# Patient Record
Sex: Female | Born: 1945 | Race: White | Hispanic: Yes | Smoking: Current every day smoker
Health system: Southern US, Community
[De-identification: ages and names within clinical notes are randomized; demographics above are authoritative.]

## PROBLEM LIST (undated history)

## (undated) DIAGNOSIS — R32 Unspecified urinary incontinence: Secondary | ICD-10-CM

## (undated) DIAGNOSIS — K219 Gastro-esophageal reflux disease without esophagitis: Secondary | ICD-10-CM

## (undated) DIAGNOSIS — G709 Myoneural disorder, unspecified: Secondary | ICD-10-CM

## (undated) DIAGNOSIS — E785 Hyperlipidemia, unspecified: Secondary | ICD-10-CM

## (undated) DIAGNOSIS — F32A Depression, unspecified: Secondary | ICD-10-CM

## (undated) DIAGNOSIS — I10 Essential (primary) hypertension: Secondary | ICD-10-CM

## (undated) DIAGNOSIS — Z794 Long term (current) use of insulin: Secondary | ICD-10-CM

## (undated) DIAGNOSIS — E119 Type 2 diabetes mellitus without complications: Secondary | ICD-10-CM

## (undated) DIAGNOSIS — F329 Major depressive disorder, single episode, unspecified: Secondary | ICD-10-CM

## (undated) HISTORY — PX: COLOSTOMY REVERSAL: SHX5782

## (undated) HISTORY — DX: Major depressive disorder, single episode, unspecified: F32.9

## (undated) HISTORY — DX: Hyperlipidemia, unspecified: E78.5

## (undated) HISTORY — PX: COLON RESECTION: SHX5231

## (undated) HISTORY — PX: COLON SURGERY: SHX602

## (undated) HISTORY — DX: Type 2 diabetes mellitus without complications: E11.9

## (undated) HISTORY — DX: Long term (current) use of insulin: Z79.4

## (undated) HISTORY — DX: Myoneural disorder, unspecified: G70.9

## (undated) HISTORY — PX: CHOLECYSTECTOMY: SHX55

## (undated) HISTORY — DX: Essential (primary) hypertension: I10

## (undated) HISTORY — DX: Unspecified urinary incontinence: R32

## (undated) HISTORY — DX: Depression, unspecified: F32.A

## (undated) HISTORY — DX: Gastro-esophageal reflux disease without esophagitis: K21.9

---

## 2002-12-05 ENCOUNTER — Ambulatory Visit (HOSPITAL_COMMUNITY): Admission: RE | Admit: 2002-12-05 | Discharge: 2002-12-05 | Payer: Self-pay

## 2008-02-03 ENCOUNTER — Emergency Department (HOSPITAL_COMMUNITY): Admission: EM | Admit: 2008-02-03 | Discharge: 2008-02-04 | Payer: Self-pay | Admitting: Emergency Medicine

## 2008-03-06 ENCOUNTER — Ambulatory Visit (HOSPITAL_COMMUNITY): Admission: RE | Admit: 2008-03-06 | Discharge: 2008-03-06 | Payer: Self-pay | Admitting: Family Medicine

## 2008-10-03 ENCOUNTER — Ambulatory Visit: Payer: Self-pay | Admitting: Cardiology

## 2008-12-04 ENCOUNTER — Ambulatory Visit: Payer: Self-pay | Admitting: Cardiology

## 2009-01-29 ENCOUNTER — Inpatient Hospital Stay (HOSPITAL_COMMUNITY): Admission: EM | Admit: 2009-01-29 | Discharge: 2009-02-01 | Payer: Self-pay | Admitting: Emergency Medicine

## 2009-01-30 ENCOUNTER — Ambulatory Visit: Payer: Self-pay | Admitting: Gastroenterology

## 2009-02-01 ENCOUNTER — Encounter: Payer: Self-pay | Admitting: Gastroenterology

## 2009-02-20 ENCOUNTER — Ambulatory Visit: Payer: Self-pay | Admitting: Gastroenterology

## 2009-02-20 DIAGNOSIS — R197 Diarrhea, unspecified: Secondary | ICD-10-CM | POA: Insufficient documentation

## 2009-02-20 DIAGNOSIS — K859 Acute pancreatitis without necrosis or infection, unspecified: Secondary | ICD-10-CM | POA: Insufficient documentation

## 2009-02-21 ENCOUNTER — Encounter: Payer: Self-pay | Admitting: Gastroenterology

## 2009-03-08 ENCOUNTER — Telehealth (INDEPENDENT_AMBULATORY_CARE_PROVIDER_SITE_OTHER): Payer: Self-pay | Admitting: *Deleted

## 2009-03-12 ENCOUNTER — Encounter: Payer: Self-pay | Admitting: Gastroenterology

## 2009-04-25 ENCOUNTER — Encounter: Payer: Self-pay | Admitting: Gastroenterology

## 2009-04-26 ENCOUNTER — Encounter: Payer: Self-pay | Admitting: Gastroenterology

## 2009-09-20 IMAGING — CT CT ABDOMEN W/ CM
2 of 5 series · 16 of 46 positions shown, 18 images · IV contrast (Omnipaque 300)
Comparison: None

CT ABDOMEN

CLINICAL DATA: Right upper quadrant abdominal pain and diarrhea.

CT ABDOMEN AND PELVIS WITH CONTRAST
TECHNIQUE: Multidetector CT imaging of the abdomen and pelvis was
performed using the standard protocol following bolus
administration of intravenous contrast.
Contrast: 100 ml of Hmnipaque-I77.

[Series 2: abd_pel_with 5.0 b40f · axial · 0.68mm/px · z∈[-389,-4]mm · 13 of 89 slices shown, 15 images]
[im 6/89  soft-tissue]
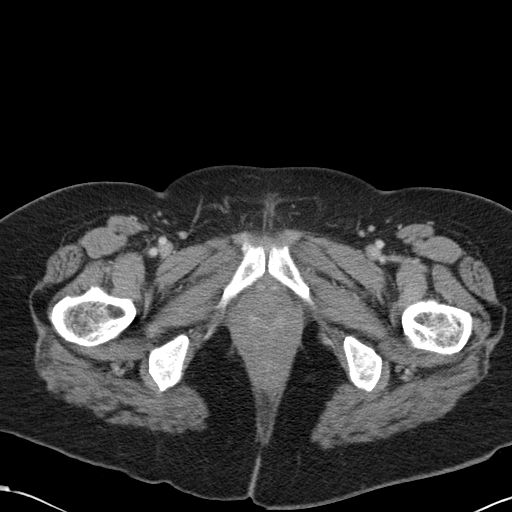
[im 6/89  bone]
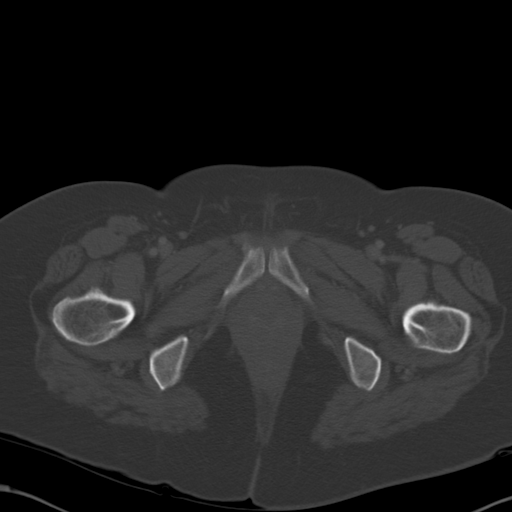
[im 12/89  soft-tissue]
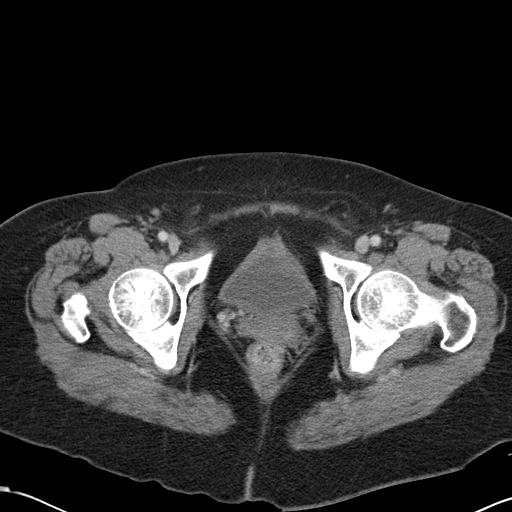
[im 18/89  soft-tissue]
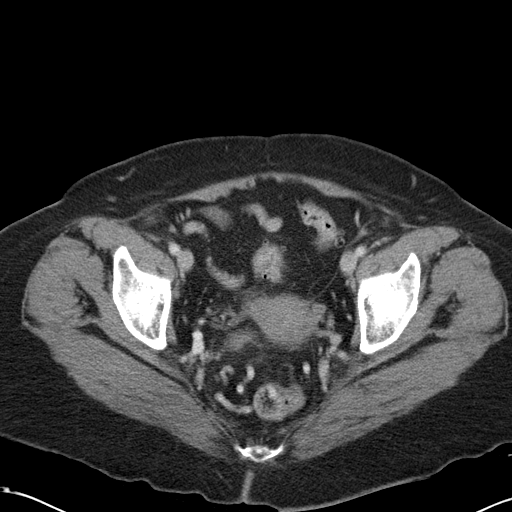
[im 24/89  soft-tissue]
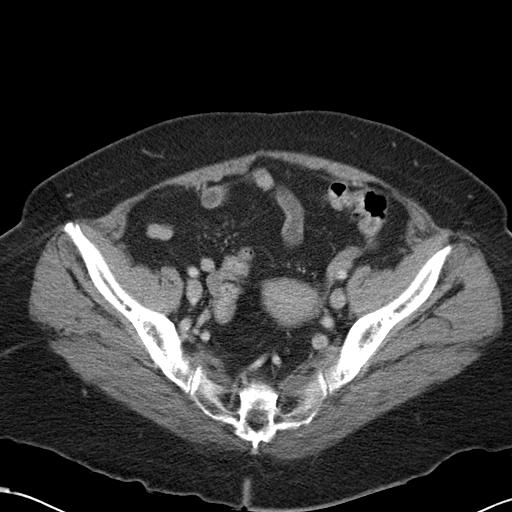
[im 30/89  soft-tissue]
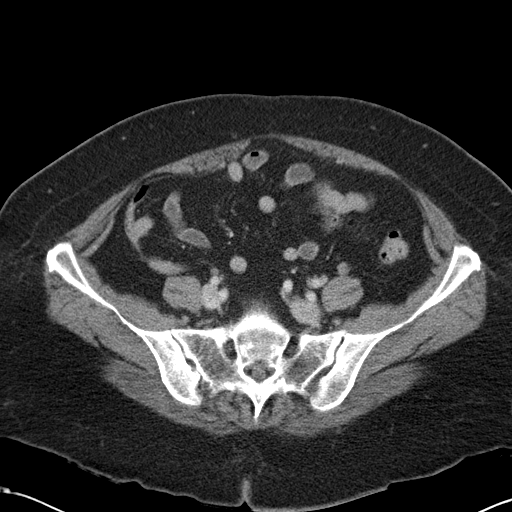
[im 36/89  soft-tissue]
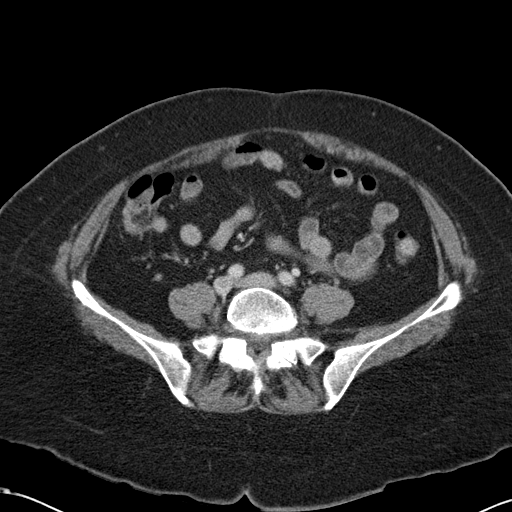
[im 47/89  soft-tissue]
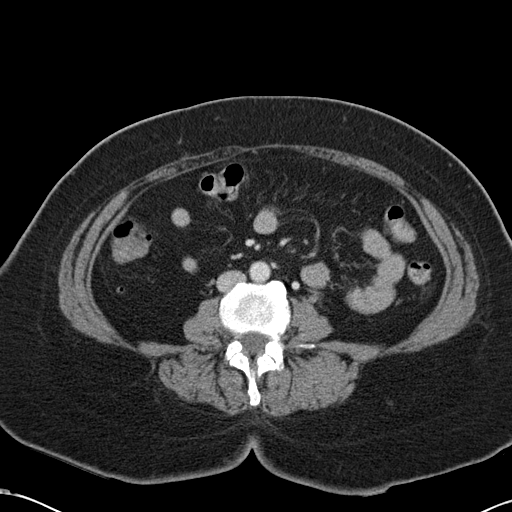
[im 53/89  soft-tissue]
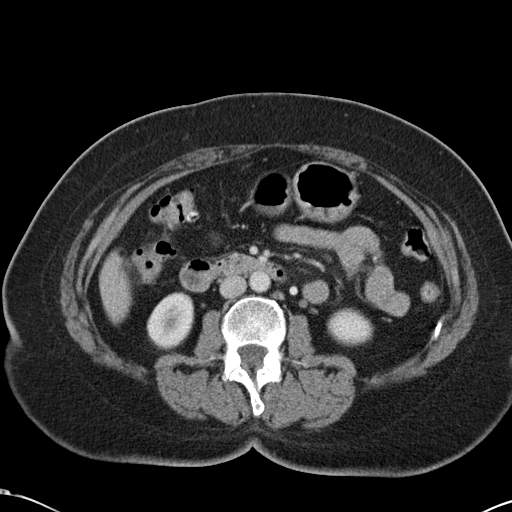
[im 59/89  soft-tissue]
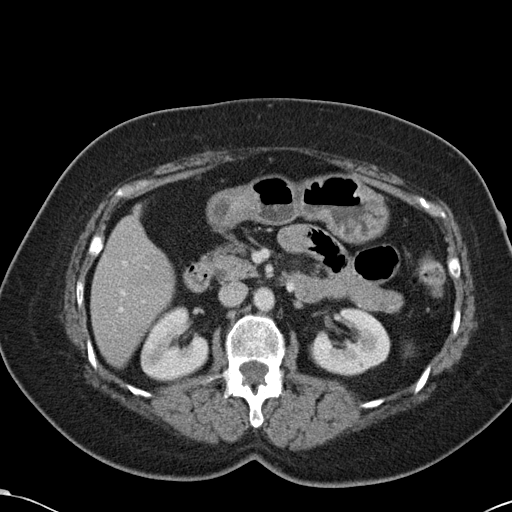
[im 59/89  bone]
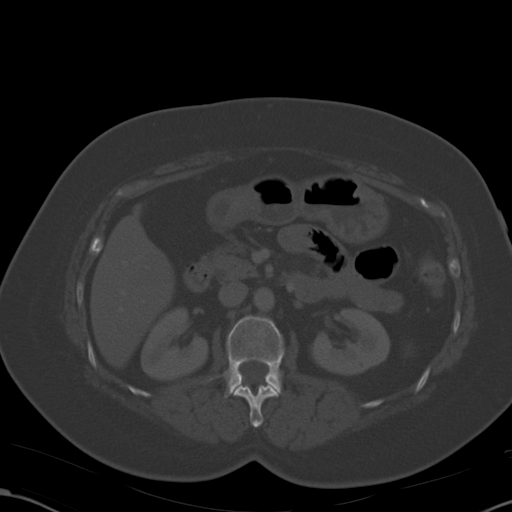
[im 65/89  soft-tissue]
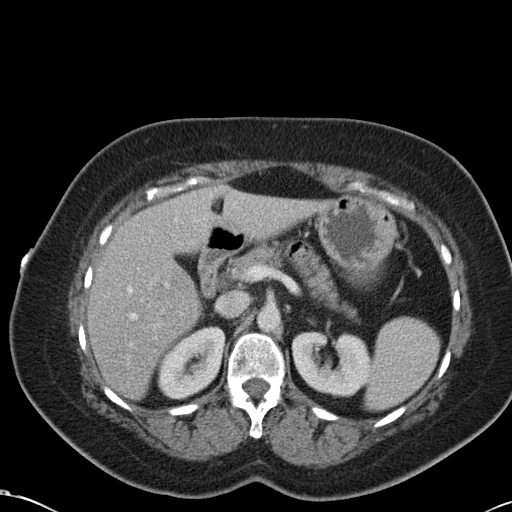
[im 71/89  soft-tissue]
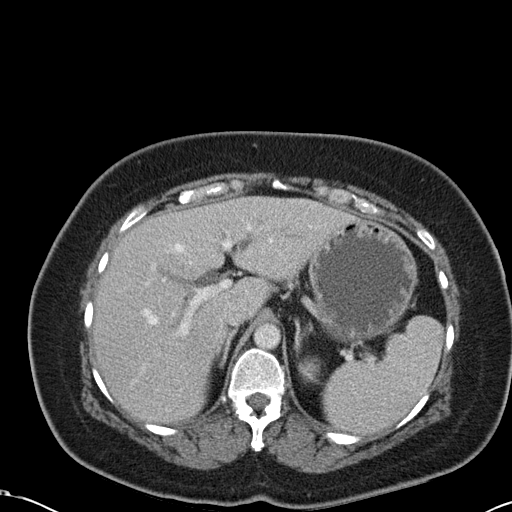
[im 77/89  soft-tissue]
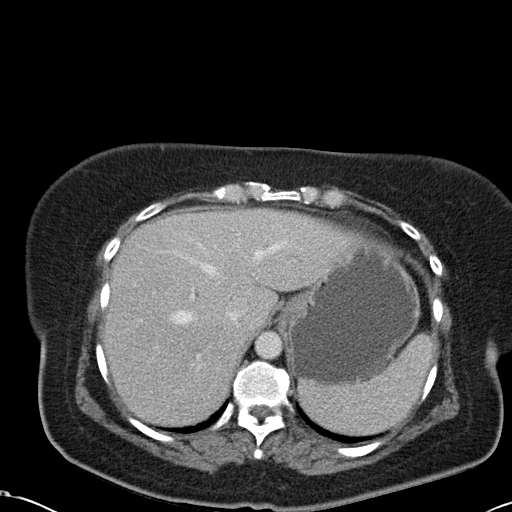
[im 83/89  soft-tissue]
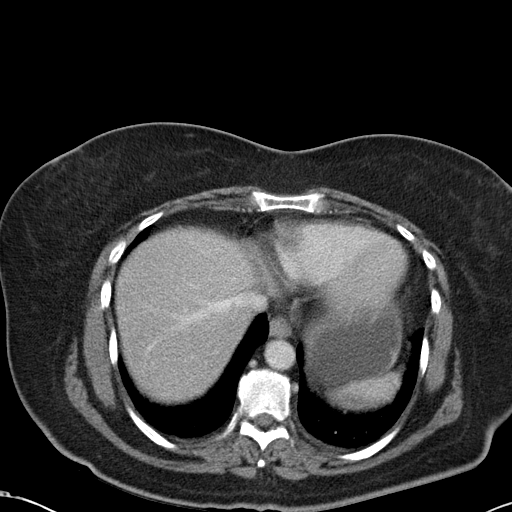

[Series 4: mpr cor post contrast (id) · coronal · 0.72mm/px · 3 of 73 slices shown]
[im 25/73  soft-tissue]
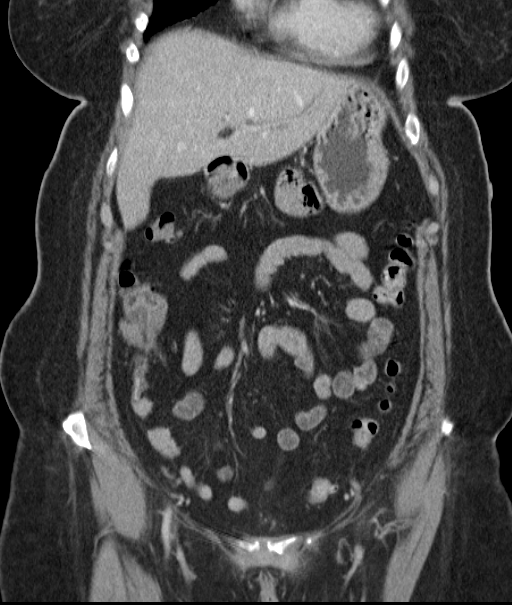
[im 33/73  soft-tissue]
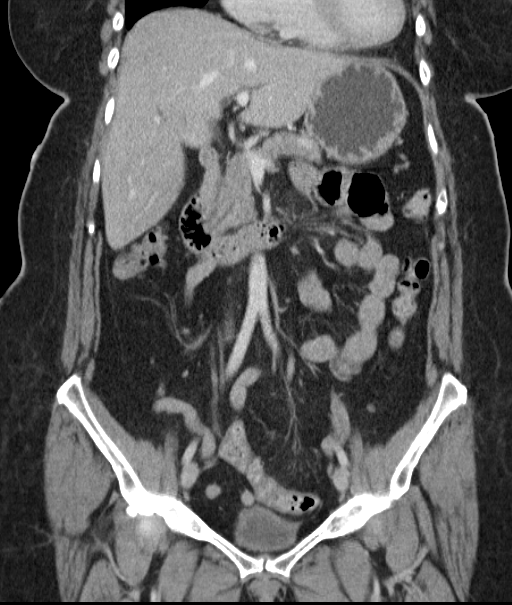
[im 41/73  soft-tissue]
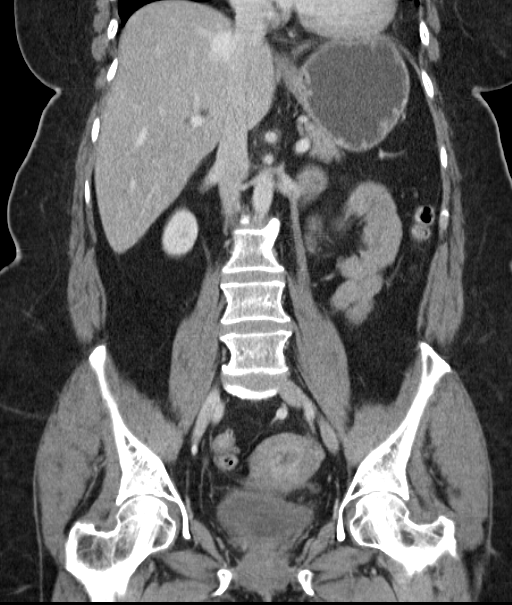

[16 of 46 positions shown; findings below may reference images not displayed]

FINDINGS: The lung bases are clear except for dependent
atelectasis.  The heart is mildly enlarged.  No pericardial
effusion.

The the patient has had a cholecystectomy.  There is mild intra and
extrahepatic biliary dilatation.  This is likely due to the prior
cholecystectomy but recommend correlation with liver function
studies.  If these are abnormal the patient may need an ERCP or
MRCP for further evaluation.  No pancreatic head mass or findings
for pancreatitis.  The spleen is normal in size.  The adrenal
glands and kidneys demonstrate no significant abnormalities.

The stomach, duodenum, small bowel and colon demonstrate no
significant findings.  There are scattered colonic diverticuli.

The aorta is normal in caliber.  No mesenteric or retroperitoneal
masses or adenopathy.  There are a few scattered nodes.

The bony structures are unremarkable.
IMPRESSION: 1.  No acute abdominal findings, mass lesions or adenopathy.
2.  Intra and extrahepatic biliary dilatation likely due to
previous cholecystectomy.  Recommend correlation with liver
function studies.
3.  Scattered colonic diverticulosis.

CT PELVIS
FINDINGS: The rectum, sigmoid colon and visualized small bowel
loops are unremarkable except for sigmoid diverticulosis. The
appendix is visualized and is normal. The uterus and ovaries appear
normal.  The bladder is unremarkable.  Prominent pelvic venous
structures likely due to pelvic congestion syndrome.  Anatomic
variation of the left ovarian vein ending in the splenic vein
instead of the left renal vein. No pelvic mass or adenopathy.  No
inguinal mass or hernia.  The bony pelvis is intact.  Mild osteitis
condensans ilei on the right side at
IMPRESSION: 1.  No acute pelvic findings, mass lesions or adenopathy.
2.  Sigmoid diverticulosis.
3.  Prominent pelvic veins suggesting pelvic congestion syndrome.

## 2010-10-07 ENCOUNTER — Encounter: Payer: Self-pay | Admitting: Gastroenterology

## 2010-10-08 ENCOUNTER — Encounter: Payer: Self-pay | Admitting: Gastroenterology

## 2010-11-13 NOTE — Letter (Signed)
Summary: CLINIC NOTE FROM DR OUTLAW  CLINIC NOTE FROM DR OUTLAW   Imported By: Rexene Alberts 10/08/2010 09:50:20  _____________________________________________________________________  External Attachment:    Type:   Image     Comment:   External Document

## 2010-11-13 NOTE — Letter (Signed)
Summary: OP REPORT  OP REPORT   Imported By: Rexene Alberts 10/07/2010 14:43:51  _____________________________________________________________________  External Attachment:    Type:   Image     Comment:   External Document

## 2011-01-21 LAB — HEPATIC FUNCTION PANEL
ALT: 36 U/L — ABNORMAL HIGH (ref 0–35)
ALT: 40 U/L — ABNORMAL HIGH (ref 0–35)
ALT: 57 U/L — ABNORMAL HIGH (ref 0–35)
AST: 24 U/L (ref 0–37)
AST: 99 U/L — ABNORMAL HIGH (ref 0–37)
Albumin: 2.9 g/dL — ABNORMAL LOW (ref 3.5–5.2)
Bilirubin, Direct: 0.1 mg/dL (ref 0.0–0.3)
Bilirubin, Direct: 0.2 mg/dL (ref 0.0–0.3)
Bilirubin, Direct: 0.5 mg/dL — ABNORMAL HIGH (ref 0.0–0.3)
Indirect Bilirubin: 0.4 mg/dL (ref 0.3–0.9)
Indirect Bilirubin: 0.7 mg/dL (ref 0.3–0.9)
Total Bilirubin: 0.6 mg/dL (ref 0.3–1.2)
Total Bilirubin: 1.2 mg/dL (ref 0.3–1.2)

## 2011-01-21 LAB — BASIC METABOLIC PANEL
BUN: 10 mg/dL (ref 6–23)
CO2: 32 mEq/L (ref 19–32)
Chloride: 107 mEq/L (ref 96–112)
Glucose, Bld: 120 mg/dL — ABNORMAL HIGH (ref 70–99)
Potassium: 3.2 mEq/L — ABNORMAL LOW (ref 3.5–5.1)
Sodium: 143 mEq/L (ref 135–145)

## 2011-01-21 LAB — URINALYSIS, ROUTINE W REFLEX MICROSCOPIC
Nitrite: NEGATIVE
Specific Gravity, Urine: 1.01 (ref 1.005–1.030)
Urobilinogen, UA: >8 mg/dL — ABNORMAL HIGH (ref 0.0–1.0)

## 2011-01-21 LAB — COMPREHENSIVE METABOLIC PANEL
Alkaline Phosphatase: 176 U/L — ABNORMAL HIGH (ref 39–117)
BUN: 8 mg/dL (ref 6–23)
Glucose, Bld: 130 mg/dL — ABNORMAL HIGH (ref 70–99)
Potassium: 3.2 mEq/L — ABNORMAL LOW (ref 3.5–5.1)
Total Protein: 5.9 g/dL — ABNORMAL LOW (ref 6.0–8.3)

## 2011-01-21 LAB — POCT CARDIAC MARKERS: Myoglobin, poc: 51 ng/mL (ref 12–200)

## 2011-01-21 LAB — CBC
HCT: 36.6 % (ref 36.0–46.0)
HCT: 37.8 % (ref 36.0–46.0)
HCT: 39.6 % (ref 36.0–46.0)
Hemoglobin: 12.8 g/dL (ref 12.0–15.0)
Hemoglobin: 13.2 g/dL (ref 12.0–15.0)
Hemoglobin: 13.7 g/dL (ref 12.0–15.0)
MCHC: 34.7 g/dL (ref 30.0–36.0)
MCHC: 34.9 g/dL (ref 30.0–36.0)
MCHC: 34.9 g/dL (ref 30.0–36.0)
RBC: 4.47 MIL/uL (ref 3.87–5.11)
RDW: 13.4 % (ref 11.5–15.5)
RDW: 13.9 % (ref 11.5–15.5)
RDW: 13.9 % (ref 11.5–15.5)

## 2011-01-21 LAB — DIFFERENTIAL
Basophils Absolute: 0 10*3/uL (ref 0.0–0.1)
Basophils Absolute: 0 10*3/uL (ref 0.0–0.1)
Basophils Absolute: 0 10*3/uL (ref 0.0–0.1)
Basophils Relative: 0 % (ref 0–1)
Basophils Relative: 0 % (ref 0–1)
Basophils Relative: 0 % (ref 0–1)
Eosinophils Relative: 2 % (ref 0–5)
Eosinophils Relative: 3 % (ref 0–5)
Monocytes Absolute: 0.3 10*3/uL (ref 0.1–1.0)
Monocytes Absolute: 0.3 10*3/uL (ref 0.1–1.0)
Monocytes Relative: 4 % (ref 3–12)
Monocytes Relative: 5 % (ref 3–12)
Neutro Abs: 4.5 10*3/uL (ref 1.7–7.7)
Neutro Abs: 6.6 10*3/uL (ref 1.7–7.7)
Neutrophils Relative %: 74 % (ref 43–77)

## 2011-01-21 LAB — HEPATITIS A ANTIBODY, TOTAL: Hep A Total Ab: POSITIVE — AB

## 2011-01-21 LAB — CLOSTRIDIUM DIFFICILE EIA: C difficile Toxins A+B, EIA: NEGATIVE

## 2011-01-21 LAB — OVA AND PARASITE EXAMINATION: Ova and parasites: NONE SEEN

## 2011-01-21 LAB — PROTIME-INR
INR: 1 (ref 0.00–1.49)
Prothrombin Time: 13.8 seconds (ref 11.6–15.2)

## 2011-01-21 LAB — GLUCOSE, CAPILLARY: Glucose-Capillary: 138 mg/dL — ABNORMAL HIGH (ref 70–99)

## 2011-02-24 NOTE — Discharge Summary (Signed)
[name], [name]            ACCOUNT NO.:  [id number]   MEDICAL RECORD NO.:  [id number]          PATIENT TYPE:  INP   LOCATION:  A337                          FACILITY:  [facility]   PHYSICIAN:  Skeet Latch, DO    DATE OF BIRTH:  24-Feb-1946   DATE OF ADMISSION:  01/29/2009  DATE OF DISCHARGE:  04/23/2010LH                               DISCHARGE SUMMARY   DISCHARGE DIAGNOSES:  1. Acute-on-chronic abdominal pain.  2. Chronic diarrhea.  3. History of hypertension.  4. History of type 2 diabetes.  5. History of gastroesophageal reflux disease.   BRIEF HOSPITAL COURSE:  This is a 65 year old Latino female who  presented with abdominal pain with associated nausea, vomiting,  diarrhea.  The patient stated that her symptoms were off and on since  she had a gallbladder removed approximately 18 years ago.  She states  her pain was located in epigastric region, radiates to her chest usually  associated with some vomiting.  She has had a chronic diarrhea since her  surgery 18 years ago.  This episode of pain is approximately a 2-week  history that radiated to her right shoulder and her throat.  She denied  any melena or rectal bleeding of any kind.  Upon being admitted, her  labs showed a white count, hemoglobin normal range, potassium was 3.2,  lipase was 205, alk phos was 176, AST 130, ALT 96.  Her CT showed a mild  intra and extrahepatic biliary dilatation likely due to prior  cholecystectomy.  It was recommended to correlate with her liver  function studies.  Upon being seen, Gastroenterology was consulted.  They recommended MRCP to evaluate her LFTs of biliary system.  The  patient was kept n.p.o., her diet was increased.  The patient had the  MRCP which showed an increased caliber of the common bile duct and  central intrahepatic bile ducts.  No evidence of choledocholithiasis or  obstructing mass.  I spoke with Gastroenterology, was not an impressive  MRCP.  I feel the patient  will probably follow up as an outpatient with  them in their office.   MEDICATIONS ON DISCHARGE:  1. Metformin 500 mg daily.  2. Celexa 10 mg daily.  3. Flexeril 10 mg daily.  4. Glipizide ER 5 mg one-half tab twice a day.  5. Tylenol With Codeine 300/30 mg as needed.  6. Atenolol/chlorthalidone 50/25 mg daily.  7. Omeprazole 40 mg daily.  8. Phenergan 25 mg 1 p.o. q.4-6 h. for nausea.   PHYSICAL EXAMINATION:  VITAL SIGNS:  Temp is 97.7, pulse 61,  respirations 20, blood pressure 97/54.  She is sating 93%.   LABORATORY DATA:  Alk phosphatase 109, AST 24, ALT 40, total protein is  5.3, albumin 2.9.  Her hepatitis C antibody was positive, lipase was 26.   CONDITION ON DISCHARGE:  Stable.   DISPOSITION:  The patient was discharged home with family members.   DISCHARGE INSTRUCTIONS:  The patient is to maintain a diabetic diet.  She is to increase her activity slowly.  The patient is to follow up  with her primary care physician in  the next 3-5 days.  She is to follow  up with Dr. Cira Servant in 2-3 weeks.  She should take all her medications as  prescribed.  The patient is to return to the emergency room and/or call  [phone] if she has any more severe abdominal pain, discomfort associated  intractable nausea, vomiting, or diarrhea.      Skeet Latch, DO  Electronically Signed     SM/MEDQ  D:  02/01/2009  T:  02/02/2009  Job:  [id number]   cc:   Kassie Mends, M.D.  [address]  [place] , [place] [address]

## 2011-02-24 NOTE — Consult Note (Signed)
[name], [name]            ACCOUNT NO.:  [id number]   MEDICAL RECORD NO.:  [id number]          PATIENT TYPE:  INP   LOCATION:  A337                          FACILITY:  [facility]   PHYSICIAN:  Kassie Mends, M.D.      DATE OF BIRTH:  [birth year]   DATE OF CONSULTATION:  01/30/2009  DATE OF DISCHARGE:                                 CONSULTATION   REASON FOR CONSULTATION:  Epigastric pain, nausea, vomiting, diarrhea.   HISTORY OF PRESENT ILLNESS:  The patient is a very pleasant 65 year old  Latino female who presented with acute on chronic abdominal pain  associated with nausea, vomiting, diarrhea. She says she has had  symptoms off and on ever since she had her gallbladder removed in [place]  about 18 years ago.  She was living in the [place] but at that  time had no forms of insurance and decided to have her surgery back in  [place].  She will have episodes of epigastric pain which radiates up  into her chest, usually associated with vomiting.  She has had chronic  diarrhea since her cholecystectomy.  Generally has multiple 7-8 stools  daily.  Stools are always post prandial.  If she avoids eating, she does  not have any stools.  In the past, she has been treated with multiple  medications for acid reflux with really no long-term benefits.  She  usually goes to the doctor and gets a shot and symptoms go away within  a day or two.  She came in this time with a two week history of  persisting epigastric pain which radiates into the right shoulder and  right throat area.  She states that the pain is severe enough that she  vomits.  She believes her diarrhea is worse as well.  She denies any  hematemesis, melena or rectal bleeding.  She has been on omeprazole 40  mg daily and takes Pepto-Bismol p.r.n.  Her daughter provides  translation for her today.  She denies any episodes of constipation.  There are times that she is pain free.  She states her sugars have been  more difficult to  control but still remain under 200.  She had a  colonoscopy about 10 years ago in [place].  She states it was normal  except she was told her colon was swollen.  She denies any prior upper  endoscopy.   Upon this admission, she had a CT which showed mild intra- and  extrahepatic biliary dilation, likely due to prior cholecystectomy that  was recommended to correlate with liver function studies.  ERCP or MRCP  recommended if abnormal.  Her pancreas appeared normal without any  evidence of pancreatitis.  She has scattered colonic diverticulosis.  She has prominent pelvic vein suggestive of pelvic congestion syndrome.   Labs revealed normal white count, normal hemoglobin.  Her potassium is  low at 3.2.  Her lipase was up at 205 today at 83.  Her total bilirubin  was 1, alk-phos 176. AST 130, ALT 96, albumin 3.4.  INR normal,  creatinine normal.   HOME MEDICATIONS:  1. Glipizide 2.5  mg b.i.d.  2. Tylenol with codeine p.r.n.  3. Atenolol/CHLOR 50/25 mg daily.  4. Metformin 500 mg daily.  5. Ambien 10 mg daily.  6. Omeprazole 40 mg daily.  7. Citalopram 10 mg daily.  8. Flexeril 10 mg daily.  9. Pepto-Bismol p.r.n.   ALLERGIES:  No known drug allergies.   PAST MEDICAL HISTORY:  1. Diabetes mellitus.  2. Hypertension.  3. GERD.   FAMILY HISTORY:  Negative for chronic GI illness, colorectal cancer, GI  malignancy or liver disease.   SOCIAL HISTORY:  She is widowed. She has five children.  She does not  speak [place].  Denies any tobacco use.  She quit about 10 years ago,  but only smoked 2-3 cigarettes a day.  Denies any alcohol use or drug  use.   REVIEW OF SYSTEMS:  See HPI for GI.  CONSTITUTIONAL:  No weight loss.  CARDIOPULMONARY:  Denies shortness of breath, palpitations.  GU:  Denies  dysuria, hematuria.   PHYSICAL EXAMINATION:  VITAL SIGNS:  Temperature 97.9, pulse 61,  respirations 20, blood pressure 115/68.  O2 saturation 99% on room air.  Height 63 inches, weight  80.5 kg.  GENERAL:  Pleasant, elderly, Latino  female in no acute distress.SKIN:  Warm and dry, no jaundice. HEENT:  Sclerae nonicteric.  Oropharyngeal mucosa moist.  CHEST:  Lungs clear to  auscultation.  CARDIAC:  Regular rate and rhythm.  No murmurs. ABDOMEN:  Obese,  positive bowel sounds.  Mild to moderate epigastric tenderness to deep  palpation.  No rebound or guarding.  No organomegaly or masses.  No  abdominal bruits or hernias.EXTREMITIES:  Lower extremities, no edema.   LABORATORY DATA:  As outlined above.  In addition, her hemoglobin is  13.2.  Sodium 138, creatinine 0.86.   IMPRESSION:  The patient is a very pleasant 65 year old lady with acute  on chronic (intermittent) epigastric pain associated with nausea and  vomiting.  She also has chronic diarrhea which has worsened.  She has  been treated empirically in the past for acid reflux disease with PPI  therapy and states she also tends to get a shot which helps her  symptoms.  For the past two weeks, she has had recurrent symptoms and  does not seems to have any improvement, and because of persisting pain  and vomiting, she presenting to the emergency department for further  management.  CT shows mild intra- and extrahepatic biliary dilation  status post cholecystectomy.  This could be a normal finding post  cholecystectomy.  However, LFTs are up as well as her lipase.  Based on  these findings, would be concerned about biliary obstruction as the  cause of her intermittent symptoms.  I doubt we are dealing with an  acute viral hepatitis.  She needs further evaluation of her biliary  system via MRCP (as discussed with Dr.  Kassie Mends).  The patient is  claustrophobic, but will try to manage that with valium, and she is  agreeable to attempt MR.  She has chronic postprandial diarrhea, 7-8  nonbloody stools daily, ever since her cholecystectomy 18 years ago.  Symptoms are currently worse according to the patient.  We need to  rule  out superimposed infection at this point.  Chronic diarrhea differential  includes bile acid induced diarrhea, IBD, microscopic colitis.   RECOMMENDATIONS:  1. MRCP to further evaluate LFTs and biliary system.  Based on labs,      she appears to have mild pancreatitis, although, no findings  on CT      to correlate with this.  Based on MRCP findings, she may need to      have an ERCP.  2. If no source of her abdominal pain and vomiting are found, would      consider upper endoscopy on a later date.  3. Consider colonoscopy for further evaluation of diarrhea.      Initially, will collect stool for OMP culture, C. difficile and      lactoferrin.  4. Will keep her n.p.o. for now.  5. Continue PPI therapy.  6. Discontinue Senna.  7. Replete potassium as per hospitalist management.   ADDENDUM:  EUS in  June 2010      Tana Coast, P.A.      Kassie Mends, M.D.  Electronically Signed    LL/MEDQ  D:  01/30/2009  T:  01/30/2009  Job:  [id number]   cc:   Dorris Singh  Fax: [phone]   Ernestina Penna, M.D.  Fax: [phone]   [facility] B Team

## 2011-02-24 NOTE — Discharge Summary (Signed)
NAMEJAINA, MORIN            ACCOUNT NO.:  [id number]   MEDICAL RECORD NO.:  [id number]          PATIENT TYPE:  INP   LOCATION:  A337                          FACILITY:  [facility]   PHYSICIAN:  Skeet Latch, DO    DATE OF BIRTH:  18-Jul-1946   DATE OF ADMISSION:  01/29/2009  DATE OF DISCHARGE:  04/23/2010LH                               DISCHARGE SUMMARY   ADDENDUM   DISCHARGE MEDICATIONS:  Protonix 40 mg one tab p.o. daily and Tylox 1-2  caps p.o. q.6 h p.r.n. and take off omeprazole 40 mg daily.      Skeet Latch, DO  Electronically Signed     SM/MEDQ  D:  02/01/2009  T:  02/02/2009  Job:  [id number]

## 2011-02-24 NOTE — H&P (Signed)
[name], [name]            ACCOUNT NO.:  [id number]   MEDICAL RECORD NO.:  [id number]          PATIENT TYPE:  INP   LOCATION:  A337                          FACILITY:  [facility]   PHYSICIAN:  Dorris Singh, DO    DATE OF BIRTH:  11-17-1945   DATE OF ADMISSION:  01/29/2009  DATE OF DISCHARGE:  LH                              HISTORY & PHYSICAL   The patient is a 64 year old Latino female who presented to [facility]  emergency room with a chief complaint of abdominal pain.  She stated  that it was right upper quadrant in nature.  It was also located in the  epigastric area, extends to her neck, into her right arm and today it  has been severe.  She had been treated recently for reflux and had some  medication changes without any luck.  Her daughter is here with her as a  Nurse, learning disability but this pain started a week ago and became acute today.  She  did have her gallbladder out about 13 years ago and does have chronic  diarrhea.  Nothing relieved her symptoms.  They are aggravated by eating  at that point and they have been associated with diarrhea, nausea and  vomiting.   REVIEW OF SYSTEMS:  CONSTITUTIONAL:  Positive for appetite change.  Negative for fever or chills.  EYES:  Negative for changes in vision or  pain.  EARS, NOSE, AND THROAT:  No changes in hearing, smell or taste.  CARDIOVASCULAR:  Negative for chest pain.  RESPIRATORY:  Negative for  wheezing or shortness of breath.  GASTROINTESTINAL:  Positive for  nausea, vomiting, diarrhea, abdominal pain.  GU: Negative for dysuria or  flank pain.  MUSCULOSKELETAL:  Negative for arthralgias or arthritis.  Neck pain.  SKIN:  Negative for rash.  NEURO:  Negative for headaches.  PSYCHIATRIC:  Negative for depression.  HEMATOLOGIC:  Negative for  anemia.   ALLERGIES:  No known drug allergies.   MEDICATIONS:  1. Glipizide 5 mg b.i.d.  2. Tylenol with codeine 300/30.  No frequency given.  3. Atenolol 50/25.  No frequency given.  4.  Metformin 500 mg.  No frequency given.  5. Zyloprim 10 mg.  No frequency given.  6. Omeprazole 40 mg.  No frequency given.   PAST MEDICAL HISTORY:  1. Diabetes.  2. Hypertension.   FAMILY HISTORY:  There is none.   SURGICAL HISTORY:  None.   SOCIAL HISTORY:  She is nonsmoker, nondrinker.  No drug abuse.  She is  also full code.   PHYSICAL EXAMINATION:  VITAL SIGNS:  Temperature 97.9, pulse 69,  respirations 20 and blood pressure 125/92.  GENERAL:  The patient is a 65 year old Latino woman who is well-  developed, well-nourished, in no acute distress.  HEENT:  Head is normocephalic, atraumatic.  PERRL.  EOMI.  No scleral  icterus or injection.  Ears:  TMI visualized bilaterally.  Nose:  Turbinates are moist.  Throat:  Mucous membranes are moist.  NECK:  Supple.  No lymphadenopathy.  HEART:  Regular rate and rhythm.  LUNGS:  Clear to auscultation bilaterally.  No rales,  wheezes or  rhonchi.  ABDOMEN:  It was soft with tenderness in the right upper quadrant and  mid epigastric region.  EXTREMITIES:  Positive pulses.  No edema,  ecchymosis or cyanosis.   LABORATORY DATA:  First set cardiac enzymes are negative.  White count  of 8.2, hemoglobin 13.7, hematocrit 39.6, platelet count 205,000.  Sodium is 138, potassium 3.2, CO2 97, carbon dioxide 30, glucose 130,  BUN 10, creatinine 0.84.  Her hepatic function is 99 for AST.  ALT is  36.  Total bilirubin is 1.2, direct is 0.5, indirect is 0.7.  Lipase is  205.  Her urine is negative.  A CT of her abdomen shows no acute intra-  abdominal findings.  No masses or adenopathy.  Intra and extrahepatic  biliary dilatation likely due to previous cholecystectomy.  Recommend  correlation with liver function studies, scattered chronic  diverticulosis and her pelvis shows no acute pelvic findings, mass  lesions or adenopathy, sigmoid diverticulosis, prominent pelvic veins  suggesting pelvic congestion syndrome.  Dr. Cira Servant was contacted   regarding her admission and she will see her tomorrow.   ASSESSMENT/PLAN:  1. Right upper quadrant abdominal pain.  2. Intractable nausea and vomiting.  3. Hypertension.  4. Diabetes.   The patient will be  admitted to the service of [facility].  Will make  her n.p.o. after midnight tonight.  Will place her on her home  medications.  Will place IV fluids.  Will do DVT and GI prophylaxis.  As  mentioned  Dr. Cira Servant has been consulted and will continue to monitor her  and make changes as necessary.      Dorris Singh, DO  Electronically Signed     CB/MEDQ  D:  01/29/2009  T:  01/30/2009  Job:  [id number]

## 2014-10-28 DIAGNOSIS — K5900 Constipation, unspecified: Secondary | ICD-10-CM | POA: Diagnosis not present

## 2014-10-28 DIAGNOSIS — K2970 Gastritis, unspecified, without bleeding: Secondary | ICD-10-CM | POA: Diagnosis not present

## 2014-10-28 DIAGNOSIS — N390 Urinary tract infection, site not specified: Secondary | ICD-10-CM | POA: Diagnosis not present

## 2014-10-28 DIAGNOSIS — N3090 Cystitis, unspecified without hematuria: Secondary | ICD-10-CM | POA: Diagnosis not present

## 2016-06-10 ENCOUNTER — Ambulatory Visit (INDEPENDENT_AMBULATORY_CARE_PROVIDER_SITE_OTHER): Payer: Medicare Other | Admitting: Family Medicine

## 2016-06-10 ENCOUNTER — Encounter: Payer: Self-pay | Admitting: Family Medicine

## 2016-06-10 VITALS — BP 130/76 | HR 60 | Resp 18 | Ht 60.0 in | Wt 200.0 lb

## 2016-06-10 DIAGNOSIS — Z7689 Persons encountering health services in other specified circumstances: Secondary | ICD-10-CM

## 2016-06-10 DIAGNOSIS — N3281 Overactive bladder: Secondary | ICD-10-CM | POA: Insufficient documentation

## 2016-06-10 DIAGNOSIS — Z79899 Other long term (current) drug therapy: Secondary | ICD-10-CM

## 2016-06-10 DIAGNOSIS — G20 Parkinson's disease: Secondary | ICD-10-CM | POA: Diagnosis not present

## 2016-06-10 DIAGNOSIS — K219 Gastro-esophageal reflux disease without esophagitis: Secondary | ICD-10-CM | POA: Diagnosis not present

## 2016-06-10 DIAGNOSIS — I10 Essential (primary) hypertension: Secondary | ICD-10-CM | POA: Insufficient documentation

## 2016-06-10 DIAGNOSIS — F32A Depression, unspecified: Secondary | ICD-10-CM | POA: Insufficient documentation

## 2016-06-10 DIAGNOSIS — K909 Intestinal malabsorption, unspecified: Secondary | ICD-10-CM

## 2016-06-10 DIAGNOSIS — Z23 Encounter for immunization: Secondary | ICD-10-CM

## 2016-06-10 DIAGNOSIS — Z136 Encounter for screening for cardiovascular disorders: Secondary | ICD-10-CM

## 2016-06-10 DIAGNOSIS — Z7189 Other specified counseling: Secondary | ICD-10-CM | POA: Diagnosis not present

## 2016-06-10 DIAGNOSIS — H6691 Otitis media, unspecified, right ear: Secondary | ICD-10-CM | POA: Insufficient documentation

## 2016-06-10 DIAGNOSIS — H7291 Unspecified perforation of tympanic membrane, right ear: Secondary | ICD-10-CM

## 2016-06-10 DIAGNOSIS — E785 Hyperlipidemia, unspecified: Secondary | ICD-10-CM

## 2016-06-10 DIAGNOSIS — F329 Major depressive disorder, single episode, unspecified: Secondary | ICD-10-CM | POA: Insufficient documentation

## 2016-06-10 DIAGNOSIS — E669 Obesity, unspecified: Secondary | ICD-10-CM

## 2016-06-10 DIAGNOSIS — K589 Irritable bowel syndrome without diarrhea: Secondary | ICD-10-CM | POA: Insufficient documentation

## 2016-06-10 DIAGNOSIS — E119 Type 2 diabetes mellitus without complications: Secondary | ICD-10-CM

## 2016-06-10 DIAGNOSIS — E1169 Type 2 diabetes mellitus with other specified complication: Secondary | ICD-10-CM | POA: Insufficient documentation

## 2016-06-10 MED ORDER — ATENOLOL-CHLORTHALIDONE 50-25 MG PO TABS: 1.0000 | ORAL_TABLET | Freq: Every day | ORAL | 3 refills | Status: DC

## 2016-06-10 MED ORDER — DICYCLOMINE HCL 10 MG PO CAPS: 10.0000 mg | ORAL_CAPSULE | Freq: Three times a day (TID) | ORAL | 3 refills | Status: DC

## 2016-06-10 MED ORDER — ATORVASTATIN CALCIUM 20 MG PO TABS: 20.0000 mg | ORAL_TABLET | Freq: Every day | ORAL | 3 refills | Status: DC

## 2016-06-10 MED ORDER — PIOGLITAZONE HCL 30 MG PO TABS: 30.0000 mg | ORAL_TABLET | Freq: Every day | ORAL | 3 refills | Status: DC

## 2016-06-10 MED ORDER — MIRABEGRON ER 50 MG PO TB24: 50.0000 mg | ORAL_TABLET | Freq: Every day | ORAL | 3 refills | Status: DC

## 2016-06-10 MED ORDER — GLUCOSE BLOOD VI STRP: ORAL_STRIP | 3 refills | Status: DC

## 2016-06-10 MED ORDER — DULOXETINE HCL 30 MG PO CPEP: 30.0000 mg | ORAL_CAPSULE | Freq: Every day | ORAL | 1 refills | Status: DC

## 2016-06-10 MED ORDER — PRIMIDONE 50 MG PO TABS: 50.0000 mg | ORAL_TABLET | Freq: Three times a day (TID) | ORAL | 1 refills | Status: DC

## 2016-06-10 MED ORDER — AMOXICILLIN 875 MG PO TABS: 875.0000 mg | ORAL_TABLET | Freq: Two times a day (BID) | ORAL | 0 refills | Status: DC

## 2016-06-10 MED ORDER — PANTOPRAZOLE SODIUM 40 MG PO TBEC: 40.0000 mg | DELAYED_RELEASE_TABLET | Freq: Every day | ORAL | 3 refills | Status: DC

## 2016-06-10 MED ORDER — INSULIN DETEMIR 100 UNIT/ML ~~LOC~~ SOLN: 25.0000 [IU] | Freq: Two times a day (BID) | SUBCUTANEOUS | 2 refills | Status: DC

## 2016-06-10 NOTE — Addendum Note (Signed)
Addended by: Kandis FantasiaSLADE, COURTNEY B on: 06/10/2016 02:49 PM   Modules accepted: Orders

## 2016-06-10 NOTE — Patient Instructions (Addendum)
Medicines are refilled  Stop citalopram  Take duloxetine once a day instead  Lab work ordered I will let you know the test results in a letter  Amoxicillin is for the ear infection Take two times a day for 10 days  Need follow up in a couple of months Call sooner for problems or questions

## 2016-06-10 NOTE — Progress Notes (Addendum)
Chief Complaint  Patient presents with  . Establish Care    moved here from [place] (Dr. Louanna Raw- [place])    Patient is a 70 year old Hispanic female who is accompanied by her daughter. The patient does not speak [place]. Patient refuses a Nurse, learning disability. She prefers to have her daughter to the translation. The [place] is signed. She has multiple medical problems. She is out of all of her medications. She does not have medical records. She is a fair historian, speaking to her daughter who translates. She has insulin-dependent diabetes. She's had diabetes for over 10 years. She requests to go off of insulin. She has not had any blood work and hemoglobin A1c testing for the last 6 months. She states her diabetes is well controlled. She states that she goes for yearly eye exams and her last exam was 6 months ago. She states she does not have any retinopathy or diabetes related eye changes. She denies any kidney problems. She states she does sometimes have numbness and pain in her feet. She does not have any wounds or history of diabetic ulcerations. She does not wear diabetic shoes. She is on hyperlipidemia treatment takes Lipitor 20 mg a day. Unknown last cholesterol. She is on an antihypertensive but not an ACE inhibitor. At future visit I will amend this. She states she checks her sugar daily. She is fairly careful with her diet. She gets no regular exercise. She denies any high or low sugars. Long-standing blood pressure has been well controlled. Patient has chronic abdominal cramping and diarrhea. She has irritable bowel syndrome. She takes when necessary Bentyl. She does not take a fiber supplement. She has a vague history of her colon "leaking" 4 years ago that required emergency surgery and colostomy. She had subsequent surgery to reverse the colostomy. She was told she did not have any blockage mass or cancer. Cause of the "leak" is uncertain. Old records are requested. Patient has a history of  Parkinson's disease. Her only medicine is Mysoline. She takes this for "shaking". She states it helps. She has a history of depression. She is on citalopram 40 mg a day. She states this is not working. She would like a different medication. I will switch her from citalopram to duloxetine at this visit to see if it helps with her foot pain and depression. She has a history of incontinence and "overactive bladder". She is on medication. She requires adult diapers. She has a history of GERD. She takes Protonix 40 mg a day. She states this does control her symptoms. Because of the high dose and chronic diarrhea, I am getting levels of vitamin D and B12 to check for malabsorption. She has an acute complaint today of right ear pain. It's been hurting for 2 weeks. She does holding her right ear as it is quite uncomfortable. She has cotton stuck in the ear. She does tell me she has a history of a perforated eardrum on that side. She has decreased hearing in the right ear. Denies any cold or sinus symptoms, no fever.  Patient Active Problem List   Diagnosis Date Noted  . GERD (gastroesophageal reflux disease) 06/10/2016  . Parkinson's disease (HCC) 06/10/2016  . Essential hypertension 06/10/2016  . IBS (irritable bowel syndrome) 06/10/2016  . Diabetes mellitus type 2 in obese (HCC) 06/10/2016  . OAB (overactive bladder) 06/10/2016  . HLD (hyperlipidemia) 06/10/2016  . Acute otitis media of right ear with perforated tympanic membrane 06/10/2016  . Depression 06/10/2016  . DIARRHEA NOS  02/20/2009   Outpatient Encounter Prescriptions as of 06/10/2016  Medication Sig  . amoxicillin (AMOXIL) 875 MG tablet Take 1 tablet (875 mg total) by mouth 2 (two) times daily.  Marland Kitchen atenolol-chlorthalidone (TENORETIC) 50-25 MG tablet Take 1 tablet by mouth daily.  Marland Kitchen atorvastatin (LIPITOR) 20 MG tablet Take 1 tablet (20 mg total) by mouth daily.  Marland Kitchen dicyclomine (BENTYL) 10 MG capsule Take 1 capsule (10 mg total) by mouth 4  (four) times daily -  before meals and at bedtime.  . DULoxetine (CYMBALTA) 30 MG capsule Take 1 capsule (30 mg total) by mouth daily.  . insulin detemir (LEVEMIR) 100 UNIT/ML injection Inject 0.25 mLs (25 Units total) into the skin 2 (two) times daily.  . mirabegron ER (MYRBETRIQ) 50 MG TB24 tablet Take 1 tablet (50 mg total) by mouth daily.  . pantoprazole (PROTONIX) 40 MG tablet Take 1 tablet (40 mg total) by mouth daily.  . pioglitazone (ACTOS) 30 MG tablet Take 1 tablet (30 mg total) by mouth daily.  . primidone (MYSOLINE) 50 MG tablet Take 1 tablet (50 mg total) by mouth 3 (three) times daily.   No facility-administered encounter medications on file as of 06/10/2016.     Family History  Problem Relation Age of Onset  . Diabetes Mother   . Cancer Brother     bone  . HIV/AIDS Brother     Social History   Social History  . Marital status: Widowed    Spouse name: N/A  . Number of children: N/A  . Years of education: N/A   Occupational History  . Not on file.   Social History Main Topics  . Smoking status: Current Every Day Smoker    Packs/day: 0.10    Types: Cigarettes  . Smokeless tobacco: Never Used  . Alcohol use No  . Drug use: No  . Sexual activity: Not Currently   Review of Systems  Constitutional: Negative for chills, fever, malaise/fatigue and weight loss.  HENT: Positive for ear discharge, ear pain and hearing loss. Negative for congestion.        Right ear 2 weeks  Eyes: Negative for blurred vision and pain.  Respiratory: Negative for cough and shortness of breath.   Cardiovascular: Negative for chest pain, palpitations and leg swelling.  Gastrointestinal: Negative for abdominal pain, constipation, diarrhea and heartburn.  Genitourinary: Negative for dysuria and frequency.  Musculoskeletal: Negative for falls, joint pain and myalgias.  Skin: Negative.   Neurological: Positive for tremors. Negative for dizziness, seizures and headaches.    Psychiatric/Behavioral: Negative for depression, memory loss and suicidal ideas. The patient is not nervous/anxious and does not have insomnia.        Depression.  Sleeps poorly   Physical Exam  Constitutional: She is oriented to person, place, and time. She appears well-developed and well-nourished. She appears distressed.  Uncomfortable.  Holding R ear  HENT:  Head: Normocephalic and atraumatic.  Right Ear: There is drainage. Tympanic membrane is perforated. Decreased hearing is noted.  Left Ear: External ear normal.  Ears:  Nose: Nose normal.  Mouth/Throat: Oropharynx is clear and moist.  Eyes: Conjunctivae are normal. Pupils are equal, round, and reactive to light.  Neck: Normal range of motion. Neck supple. No thyromegaly present.  Cardiovascular: Normal rate, regular rhythm and normal heart sounds.   Pulmonary/Chest: Effort normal and breath sounds normal. No respiratory distress.  Musculoskeletal: Normal range of motion. She exhibits no edema.  Lymphadenopathy:    She has no cervical adenopathy.  Neurological:  She is alert and oriented to person, place, and time.  Psychiatric: She has a normal mood and affect. Her behavior is normal.  Quiet.  Daughter did all the talking    DIAGNOSIS/PLAN:  1. Gastroesophageal reflux disease, esophagitis presence not specified Continue protonix.  Attempt to reduce dose to 20 mg a day.  Check labs.  2. Parkinson's disease (HCC)  - B12  3. Essential hypertension  - COMPLETE METABOLIC PANEL WITH GFR - Lipid Profile - TSH - CBC  4. IBS (irritable bowel syndrome)   5. Diabetes mellitus type 2 in obese (HCC)  - COMPLETE METABOLIC PANEL WITH GFR - HgB [id number] - Lipid Profile - Vitamin D (25 hydroxy) - B12 - TSH - CBC Will try to get off of insulin at her request, after checking labs.  Needs to be on an ACE inhibitor.  Needs yearly eye exams.    6. OAB (overactive bladder)   7. Encounter for immunization  - Flu Vaccine QUAD  36+ mos IM  Patient Instructions  Medicines are refilled  Stop citalopram  Take duloxetine once a day instead  Lab work ordered I will let you know the test results in a letter  Amoxicillin is for the ear infection Take two times a day for 10 days  Need follow up in a couple of months Call sooner for problems or questions

## 2016-06-11 ENCOUNTER — Other Ambulatory Visit: Payer: Self-pay

## 2016-06-11 DIAGNOSIS — G20 Parkinson's disease: Secondary | ICD-10-CM | POA: Diagnosis not present

## 2016-06-11 DIAGNOSIS — K909 Intestinal malabsorption, unspecified: Secondary | ICD-10-CM | POA: Diagnosis not present

## 2016-06-11 DIAGNOSIS — Z79899 Other long term (current) drug therapy: Secondary | ICD-10-CM | POA: Diagnosis not present

## 2016-06-11 DIAGNOSIS — E669 Obesity, unspecified: Secondary | ICD-10-CM | POA: Diagnosis not present

## 2016-06-11 DIAGNOSIS — K589 Irritable bowel syndrome without diarrhea: Secondary | ICD-10-CM | POA: Diagnosis not present

## 2016-06-11 DIAGNOSIS — E119 Type 2 diabetes mellitus without complications: Secondary | ICD-10-CM | POA: Diagnosis not present

## 2016-06-11 DIAGNOSIS — Z136 Encounter for screening for cardiovascular disorders: Secondary | ICD-10-CM | POA: Diagnosis not present

## 2016-06-11 DIAGNOSIS — I10 Essential (primary) hypertension: Secondary | ICD-10-CM | POA: Diagnosis not present

## 2016-06-11 LAB — COMPLETE METABOLIC PANEL WITH GFR
ALBUMIN: 3.7 g/dL (ref 3.6–5.1)
ALK PHOS: 121 U/L (ref 33–130)
ALT: 8 U/L (ref 6–29)
AST: 14 U/L (ref 10–35)
BILIRUBIN TOTAL: 0.6 mg/dL (ref 0.2–1.2)
BUN: 18 mg/dL (ref 7–25)
CALCIUM: 8.9 mg/dL (ref 8.6–10.4)
CO2: 28 mmol/L (ref 20–31)
CREATININE: 1.07 mg/dL — AB (ref 0.50–0.99)
Chloride: 101 mmol/L (ref 98–110)
GFR, Est African American: 62 mL/min (ref >=60)
GFR, Est Non African American: 54 mL/min — ABNORMAL LOW (ref >=60)
GLUCOSE: 135 mg/dL — AB (ref 65–99)
POTASSIUM: 3.7 mmol/L (ref 3.5–5.3)
SODIUM: 142 mmol/L (ref 135–146)
TOTAL PROTEIN: 6.4 g/dL (ref 6.1–8.1)

## 2016-06-11 LAB — LIPID PANEL
CHOL/HDL RATIO: 2.6 ratio (ref <=5.0)
Cholesterol: 166 mg/dL (ref 125–200)
HDL: 65 mg/dL (ref >=46)
LDL CALC: 81 mg/dL (ref <130)
TRIGLYCERIDES: 102 mg/dL (ref <150)
VLDL: 20 mg/dL (ref <30)

## 2016-06-11 LAB — CBC
HEMATOCRIT: 39.9 % (ref 35.0–45.0)
HEMOGLOBIN: 13.2 g/dL (ref 11.7–15.5)
MCH: 29.2 pg (ref 27.0–33.0)
MCHC: 33.1 g/dL (ref 32.0–36.0)
MCV: 88.3 fL (ref 80.0–100.0)
MPV: 10.2 fL (ref 7.5–12.5)
Platelets: 215 10*3/uL (ref 140–400)
RBC: 4.52 MIL/uL (ref 3.80–5.10)
RDW: 14.5 % (ref 11.0–15.0)
WBC: 6 10*3/uL (ref 3.8–10.8)

## 2016-06-11 LAB — TSH: TSH: 2.52 mIU/L

## 2016-06-11 LAB — VITAMIN B12: Vitamin B-12: 311 pg/mL (ref 200–1100)

## 2016-06-11 MED ORDER — INSULIN DETEMIR 100 UNIT/ML ~~LOC~~ SOLN: 25.0000 [IU] | Freq: Two times a day (BID) | SUBCUTANEOUS | 2 refills | Status: AC

## 2016-06-11 MED ORDER — AMOXICILLIN 875 MG PO TABS: 875.0000 mg | ORAL_TABLET | Freq: Two times a day (BID) | ORAL | 0 refills | Status: DC

## 2016-06-11 MED ORDER — ATORVASTATIN CALCIUM 20 MG PO TABS: 20.0000 mg | ORAL_TABLET | Freq: Every day | ORAL | 3 refills | Status: AC

## 2016-06-11 MED ORDER — DULOXETINE HCL 30 MG PO CPEP: 30.0000 mg | ORAL_CAPSULE | Freq: Every day | ORAL | 1 refills | Status: AC

## 2016-06-11 MED ORDER — PANTOPRAZOLE SODIUM 40 MG PO TBEC: 40.0000 mg | DELAYED_RELEASE_TABLET | Freq: Every day | ORAL | 3 refills | Status: AC

## 2016-06-11 MED ORDER — ATENOLOL-CHLORTHALIDONE 50-25 MG PO TABS
1.0000 | ORAL_TABLET | Freq: Every day | ORAL | 3 refills | Status: DC
Start: 2016-06-11 — End: 2016-06-25

## 2016-06-11 MED ORDER — PRIMIDONE 50 MG PO TABS: 50.0000 mg | ORAL_TABLET | Freq: Three times a day (TID) | ORAL | 1 refills | Status: DC

## 2016-06-11 MED ORDER — DICYCLOMINE HCL 10 MG PO CAPS: 10.0000 mg | ORAL_CAPSULE | Freq: Three times a day (TID) | ORAL | 3 refills | Status: AC

## 2016-06-11 MED ORDER — GLUCOSE BLOOD VI STRP: ORAL_STRIP | 3 refills | Status: AC

## 2016-06-11 MED ORDER — PIOGLITAZONE HCL 30 MG PO TABS: 30.0000 mg | ORAL_TABLET | Freq: Every day | ORAL | 3 refills | Status: DC

## 2016-06-11 MED ORDER — MIRABEGRON ER 50 MG PO TB24: 50.0000 mg | ORAL_TABLET | Freq: Every day | ORAL | 3 refills | Status: AC

## 2016-06-12 ENCOUNTER — Encounter: Payer: Self-pay | Admitting: Family Medicine

## 2016-06-12 LAB — HEMOGLOBIN A1C
HEMOGLOBIN A1C: 6.7 % — AB (ref <5.7)
MEAN PLASMA GLUCOSE: 146 mg/dL

## 2016-06-12 LAB — MICROALBUMIN / CREATININE URINE RATIO
CREATININE, URINE: 198 mg/dL (ref 20–320)
MICROALB UR: 0.7 mg/dL
Microalb Creat Ratio: 4 mcg/mg creat (ref <30)

## 2016-06-12 LAB — VITAMIN D 25 HYDROXY (VIT D DEFICIENCY, FRACTURES): VIT D 25 HYDROXY: 23 ng/mL — AB (ref 30–100)

## 2016-06-25 ENCOUNTER — Ambulatory Visit (INDEPENDENT_AMBULATORY_CARE_PROVIDER_SITE_OTHER): Payer: Medicare Other | Admitting: Family Medicine

## 2016-06-25 ENCOUNTER — Encounter: Payer: Self-pay | Admitting: Family Medicine

## 2016-06-25 VITALS — BP 128/62 | HR 70 | Temp 99.0°F | Resp 18 | Ht 60.0 in | Wt 202.0 lb

## 2016-06-25 DIAGNOSIS — Z23 Encounter for immunization: Secondary | ICD-10-CM

## 2016-06-25 DIAGNOSIS — G20 Parkinson's disease: Secondary | ICD-10-CM

## 2016-06-25 DIAGNOSIS — H6691 Otitis media, unspecified, right ear: Secondary | ICD-10-CM | POA: Diagnosis not present

## 2016-06-25 DIAGNOSIS — E669 Obesity, unspecified: Secondary | ICD-10-CM

## 2016-06-25 DIAGNOSIS — H7291 Unspecified perforation of tympanic membrane, right ear: Secondary | ICD-10-CM

## 2016-06-25 DIAGNOSIS — I10 Essential (primary) hypertension: Secondary | ICD-10-CM | POA: Diagnosis not present

## 2016-06-25 DIAGNOSIS — E1169 Type 2 diabetes mellitus with other specified complication: Secondary | ICD-10-CM

## 2016-06-25 DIAGNOSIS — E119 Type 2 diabetes mellitus without complications: Secondary | ICD-10-CM

## 2016-06-25 MED ORDER — LISINOPRIL-HYDROCHLOROTHIAZIDE 20-25 MG PO TABS: 1.0000 | ORAL_TABLET | Freq: Every day | ORAL | 3 refills | Status: AC

## 2016-06-25 MED ORDER — METFORMIN HCL 500 MG PO TABS: 500.0000 mg | ORAL_TABLET | Freq: Two times a day (BID) | ORAL | 3 refills | Status: AC

## 2016-06-25 NOTE — Patient Instructions (Signed)
I am referring you to an ear specialist for the ruptured ear drum  I am referring you to a neurologist for the tremor For now stay on the mysoline 3 times a day  I am changing the blood pressure medicine.  Stop the tenoretic and start the lisinopril This blood pressure medicine better protects the kidneys  You may cut the insulin in half and use 12 u in the morning and 16 u in the evening Start the metformin 500 mg twice a day with breakfast and dinner  Walk every day that you are able.  Walk on the treadmill at slow speed.  Start with 5-10 min at a time and work up to 15 min twice a day.  See me in a month.  Bring in your blood sugar log  Call sooner for any problems  NEED OLD RECORDS

## 2016-06-25 NOTE — Progress Notes (Signed)
Chief Complaint  Patient presents with  . Ear Drainage    right ear drainage continues    70 year old Hispanic woman new to area. Refuses a Nurse, learning disabilitytranslator. Prefers to use her daughter. Is here for follow-up. Multiple issues to manage. She has an insulin-dependent diabetic. I do not have old records. However, some laboratory work at her last visit. These test results are reviewed with her. Her diabetes is well managed. Her hemoglobin A1c is 6.7. Her fasting surgery was 135. She has had no high or low sugars. She states that when she was diagnosed as diabetic she was put directly on insulin. She does not want to take insulin. She would like a trial of oral hypoglycemic. She is on over 50 units a day. I am uncertain whether we can discontinue her insulin completely, but I will add metformin, cut her insulin in half, and follow her sugars to see whether the insulin can be reduced. She objects both to the injections and the cost of insulin use. She also complains she has gained weight. She states that she has neuropathy in her hands and feet. Her hands wake her up at night than they are numb. She has not seen a podiatrist. She does not have any foot wounds, ulcerations, surgeries She denies any eye problems from her diabetes. She states she's had an eye exam within the last year. These records are requested. Her lab work did show mildly impaired creatinine. She is on a beta blocker/thiazide like pressure pill. I'm going to switch her to an ACE inhibitor to better protect her kidneys and comply with current diabetes recommendations. Patient states she has Parkinson's disease. She uses Mysoline 3 times a day. She does not feel like it helps with her tremor. The daughter states she walks very slowly. They want a stronger medication. Without additional records I am hesitant to start her on Sinemet. I'm going to send her for a neurology consultation. At her last visit she had an ear infection with ruptured  tympanic membrane. She states the pain is improved. She still can't hear well. She still having drainage from the ear. She would like this rechecked.  Patient Active Problem List   Diagnosis Date Noted  . GERD (gastroesophageal reflux disease) 06/10/2016  . Parkinson's disease (HCC) 06/10/2016  . Essential hypertension 06/10/2016  . IBS (irritable bowel syndrome) 06/10/2016  . Diabetes mellitus type 2 in obese (HCC) 06/10/2016  . OAB (overactive bladder) 06/10/2016  . HLD (hyperlipidemia) 06/10/2016  . Acute otitis media of right ear with perforated tympanic membrane 06/10/2016  . Depression 06/10/2016  . DIARRHEA NOS 02/20/2009    Outpatient Encounter Prescriptions as of 06/25/2016  Medication Sig  . atorvastatin (LIPITOR) 20 MG tablet Take 1 tablet (20 mg total) by mouth daily.  Marland Kitchen. dicyclomine (BENTYL) 10 MG capsule Take 1 capsule (10 mg total) by mouth 4 (four) times daily -  before meals and at bedtime.  . DULoxetine (CYMBALTA) 30 MG capsule Take 1 capsule (30 mg total) by mouth daily.  Marland Kitchen. glucose blood (ACCU-CHEK AVIVA PLUS) test strip Use as instructed for twice daily testing E11.9  . insulin detemir (LEVEMIR) 100 UNIT/ML injection Inject 0.25 mLs (25 Units total) into the skin 2 (two) times daily.  . mirabegron ER (MYRBETRIQ) 50 MG TB24 tablet Take 1 tablet (50 mg total) by mouth daily.  . pantoprazole (PROTONIX) 40 MG tablet Take 1 tablet (40 mg total) by mouth daily.  . pioglitazone (ACTOS) 30 MG tablet Take 1  tablet (30 mg total) by mouth daily.  . primidone (MYSOLINE) 50 MG tablet Take 1 tablet (50 mg total) by mouth 3 (three) times daily.  . [DISCONTINUED] atenolol-chlorthalidone (TENORETIC) 50-25 MG tablet Take 1 tablet by mouth daily.  Marland Kitchen lisinopril-hydrochlorothiazide (PRINZIDE,ZESTORETIC) 20-25 MG tablet Take 1 tablet by mouth daily.  . metFORMIN (GLUCOPHAGE) 500 MG tablet Take 1 tablet (500 mg total) by mouth 2 (two) times daily with a meal.  . [DISCONTINUED] amoxicillin  (AMOXIL) 875 MG tablet Take 1 tablet (875 mg total) by mouth 2 (two) times daily.   No facility-administered encounter medications on file as of 06/25/2016.     No Known Allergies  Review of Systems  Constitutional: Negative for activity change and unexpected weight change.       Gained 2 pounds  HENT: Positive for ear discharge, ear pain and hearing loss. Negative for congestion, dental problem, postnasal drip and rhinorrhea.   Eyes: Negative.  Negative for visual disturbance.  Respiratory: Negative.  Negative for cough and shortness of breath.   Cardiovascular: Negative for chest pain, palpitations and leg swelling.  Gastrointestinal: Negative for constipation and diarrhea.  Endocrine: Negative for polydipsia and polyuria.  Genitourinary: Negative.  Negative for difficulty urinating.       Occasional stress incontinence  Musculoskeletal: Positive for arthralgias and myalgias.       Describes general achiness in "bones"  Neurological: Positive for tremors.       Slow gait. No falls.  Psychiatric/Behavioral: Negative for dysphoric mood and sleep disturbance.       History of depression, controlled    BP 128/62   Pulse 70   Temp 99 F (37.2 C)   Resp 18   Ht 5' (1.524 m)   Wt 202 lb (91.6 kg)   SpO2 96%   BMI 39.45 kg/m   Physical Exam  Constitutional: She is oriented to person, place, and time. She appears well-developed and well-nourished.  HENT:  Head: Normocephalic and atraumatic.  Right Ear: External ear normal.  Left Ear: External ear normal.  Ears:  Mouth/Throat: Oropharynx is clear and moist.  Eyes: Conjunctivae are normal. Pupils are equal, round, and reactive to light.  Neck: Normal range of motion.  Cardiovascular: Normal rate, regular rhythm and normal heart sounds.   Pulmonary/Chest: Effort normal and breath sounds normal. No respiratory distress. She has no wheezes.  Musculoskeletal: Normal range of motion. She exhibits no edema.  Lymphadenopathy:     She has no cervical adenopathy.  Neurological: She is alert and oriented to person, place, and time.  Psychiatric: She has a normal mood and affect. Her behavior is normal. Thought content normal.       ASSESSMENT/PLAN:   1. Diabetes mellitus type 2 in obese (HCC) Going to try to taper insulin and start metformin. Patient will keep a log of her blood sugars and bring back to next visit. She will call for problems.  2. Acute otitis media of right ear with perforated tympanic membrane  - Ambulatory referral to ENT  3. Parkinson's disease (HCC)  - Ambulatory referral to Neurology  4. Essential hypertension Discontinue atenolol. Start lisinopril. Follow blood pressure.   Patient Instructions  I am referring you to an ear specialist for the ruptured ear drum  I am referring you to a neurologist for the tremor For now stay on the mysoline 3 times a day  I am changing the blood pressure medicine.  Stop the tenoretic and start the lisinopril This blood pressure medicine better  protects the kidneys  You may cut the insulin in half and use 12 u in the morning and 16 u in the evening Start the metformin 500 mg twice a day with breakfast and dinner  Walk every day that you are able.  Walk on the treadmill at slow speed.  Start with 5-10 min at a time and work up to 15 min twice a day.  See me in a month.  Bring in your blood sugar log  Call sooner for any problems  NEED OLD RECORDS   Eustace Moore, MD

## 2016-07-06 ENCOUNTER — Ambulatory Visit (INDEPENDENT_AMBULATORY_CARE_PROVIDER_SITE_OTHER): Payer: Medicare Other | Admitting: Otolaryngology

## 2016-07-08 ENCOUNTER — Ambulatory Visit: Payer: Self-pay | Admitting: Neurology

## 2016-07-08 ENCOUNTER — Telehealth: Payer: Self-pay | Admitting: *Deleted

## 2016-07-08 NOTE — Telephone Encounter (Signed)
No showed new patient appointment. 

## 2016-07-09 ENCOUNTER — Encounter: Payer: Self-pay | Admitting: Neurology

## 2016-07-27 ENCOUNTER — Ambulatory Visit: Payer: Medicare Other | Admitting: Family Medicine

## 2016-09-10 ENCOUNTER — Ambulatory Visit: Payer: Medicare Other | Admitting: Family Medicine

## 2016-11-30 DIAGNOSIS — N3941 Urge incontinence: Secondary | ICD-10-CM | POA: Diagnosis not present

## 2016-11-30 DIAGNOSIS — Z0001 Encounter for general adult medical examination with abnormal findings: Secondary | ICD-10-CM | POA: Diagnosis not present

## 2016-11-30 DIAGNOSIS — E7800 Pure hypercholesterolemia, unspecified: Secondary | ICD-10-CM | POA: Diagnosis not present

## 2016-11-30 DIAGNOSIS — Z6835 Body mass index (BMI) 35.0-35.9, adult: Secondary | ICD-10-CM | POA: Diagnosis not present

## 2016-11-30 DIAGNOSIS — R300 Dysuria: Secondary | ICD-10-CM | POA: Diagnosis not present

## 2016-11-30 DIAGNOSIS — E119 Type 2 diabetes mellitus without complications: Secondary | ICD-10-CM | POA: Diagnosis not present

## 2016-11-30 DIAGNOSIS — R1312 Dysphagia, oropharyngeal phase: Secondary | ICD-10-CM | POA: Diagnosis not present

## 2016-11-30 DIAGNOSIS — I10 Essential (primary) hypertension: Secondary | ICD-10-CM | POA: Diagnosis not present

## 2016-11-30 DIAGNOSIS — F17210 Nicotine dependence, cigarettes, uncomplicated: Secondary | ICD-10-CM | POA: Diagnosis not present

## 2016-11-30 DIAGNOSIS — Z794 Long term (current) use of insulin: Secondary | ICD-10-CM | POA: Diagnosis not present

## 2016-11-30 DIAGNOSIS — Z716 Tobacco abuse counseling: Secondary | ICD-10-CM | POA: Diagnosis not present

## 2016-12-04 DIAGNOSIS — H401131 Primary open-angle glaucoma, bilateral, mild stage: Secondary | ICD-10-CM | POA: Diagnosis not present

## 2016-12-04 DIAGNOSIS — H2513 Age-related nuclear cataract, bilateral: Secondary | ICD-10-CM | POA: Diagnosis not present

## 2016-12-30 ENCOUNTER — Other Ambulatory Visit: Payer: Self-pay | Admitting: Family Medicine

## 2017-01-09 ENCOUNTER — Other Ambulatory Visit: Payer: Self-pay | Admitting: Family Medicine

## 2017-01-11 NOTE — Telephone Encounter (Signed)
Pt was here to establish care with you on 9 [phone] 17. She has cancelled 2 appt with you since then. We are getting refill requests , but I am not able to reac her.

## 2017-01-22 ENCOUNTER — Other Ambulatory Visit: Payer: Self-pay | Admitting: Family Medicine

## 2017-05-12 ENCOUNTER — Telehealth: Payer: Self-pay

## 2017-05-12 NOTE — Telephone Encounter (Signed)
Called pt to schedule Medicare Annual Wellness Visit. -nr  

## 2017-07-06 DIAGNOSIS — E119 Type 2 diabetes mellitus without complications: Secondary | ICD-10-CM | POA: Diagnosis not present

## 2017-07-06 DIAGNOSIS — H2513 Age-related nuclear cataract, bilateral: Secondary | ICD-10-CM | POA: Diagnosis not present

## 2017-07-06 DIAGNOSIS — H04123 Dry eye syndrome of bilateral lacrimal glands: Secondary | ICD-10-CM | POA: Diagnosis not present

## 2017-07-06 DIAGNOSIS — H40013 Open angle with borderline findings, low risk, bilateral: Secondary | ICD-10-CM | POA: Diagnosis not present

## 2017-12-20 ENCOUNTER — Encounter: Payer: Self-pay | Admitting: Family Medicine

## 2023-03-01 ENCOUNTER — Other Ambulatory Visit (HOSPITAL_COMMUNITY): Payer: Self-pay | Admitting: Family Medicine

## 2023-03-01 DIAGNOSIS — R42 Dizziness and giddiness: Secondary | ICD-10-CM

## 2023-04-07 ENCOUNTER — Ambulatory Visit (HOSPITAL_COMMUNITY): Payer: 59

## 2023-04-14 ENCOUNTER — Encounter (HOSPITAL_COMMUNITY): Payer: Self-pay

## 2023-04-14 ENCOUNTER — Ambulatory Visit (HOSPITAL_COMMUNITY): Admission: RE | Admit: 2023-04-14 | Payer: 59 | Source: Ambulatory Visit

## 2023-11-12 ENCOUNTER — Other Ambulatory Visit (HOSPITAL_COMMUNITY): Payer: Self-pay | Admitting: Family Medicine

## 2023-11-12 ENCOUNTER — Ambulatory Visit (HOSPITAL_COMMUNITY)
Admission: RE | Admit: 2023-11-12 | Discharge: 2023-11-12 | Disposition: A | Discharge status: Home / Self Care | Payer: 59 | Source: Ambulatory Visit | Attending: Family Medicine | Admitting: Family Medicine

## 2023-11-12 DIAGNOSIS — R109 Unspecified abdominal pain: Secondary | ICD-10-CM | POA: Diagnosis present

## 2023-11-12 LAB — POCT I-STAT CREATININE: Creatinine, Ser: 1 mg/dL (ref 0.44–1.00)

## 2023-11-12 MED ORDER — IOHEXOL 300 MG/ML  SOLN
30.0000 mL | Freq: Once | INTRAMUSCULAR | Status: AC | PRN
  Administered 2023-11-12: 30 mL via ORAL

## 2023-11-12 MED ORDER — IOHEXOL 300 MG/ML  SOLN
100.0000 mL | Freq: Once | INTRAMUSCULAR | Status: AC | PRN
  Administered 2023-11-12: 100 mL via INTRAVENOUS

## 2023-11-15 ENCOUNTER — Encounter (HOSPITAL_COMMUNITY): Payer: Self-pay

## 2023-11-15 ENCOUNTER — Emergency Department (HOSPITAL_COMMUNITY): Payer: 59

## 2023-11-15 ENCOUNTER — Emergency Department (HOSPITAL_COMMUNITY)
Admission: EM | Admit: 2023-11-15 | Discharge: 2023-11-15 | Disposition: A | Discharge status: Home / Self Care | Payer: 59 | Attending: Emergency Medicine | Admitting: Emergency Medicine

## 2023-11-15 ENCOUNTER — Other Ambulatory Visit: Payer: Self-pay

## 2023-11-15 DIAGNOSIS — R059 Cough, unspecified: Secondary | ICD-10-CM | POA: Insufficient documentation

## 2023-11-15 DIAGNOSIS — E119 Type 2 diabetes mellitus without complications: Secondary | ICD-10-CM | POA: Insufficient documentation

## 2023-11-15 DIAGNOSIS — G8929 Other chronic pain: Secondary | ICD-10-CM | POA: Diagnosis not present

## 2023-11-15 DIAGNOSIS — H6690 Otitis media, unspecified, unspecified ear: Secondary | ICD-10-CM

## 2023-11-15 DIAGNOSIS — R079 Chest pain, unspecified: Secondary | ICD-10-CM | POA: Diagnosis not present

## 2023-11-15 DIAGNOSIS — K429 Umbilical hernia without obstruction or gangrene: Secondary | ICD-10-CM | POA: Diagnosis not present

## 2023-11-15 DIAGNOSIS — H60501 Unspecified acute noninfective otitis externa, right ear: Secondary | ICD-10-CM | POA: Insufficient documentation

## 2023-11-15 DIAGNOSIS — J210 Acute bronchiolitis due to respiratory syncytial virus: Secondary | ICD-10-CM | POA: Diagnosis not present

## 2023-11-15 DIAGNOSIS — R0602 Shortness of breath: Secondary | ICD-10-CM | POA: Diagnosis not present

## 2023-11-15 DIAGNOSIS — E785 Hyperlipidemia, unspecified: Secondary | ICD-10-CM | POA: Insufficient documentation

## 2023-11-15 DIAGNOSIS — N3000 Acute cystitis without hematuria: Secondary | ICD-10-CM | POA: Insufficient documentation

## 2023-11-15 DIAGNOSIS — Z7984 Long term (current) use of oral hypoglycemic drugs: Secondary | ICD-10-CM | POA: Diagnosis not present

## 2023-11-15 DIAGNOSIS — R1013 Epigastric pain: Secondary | ICD-10-CM | POA: Diagnosis present

## 2023-11-15 DIAGNOSIS — Z20822 Contact with and (suspected) exposure to covid-19: Secondary | ICD-10-CM | POA: Insufficient documentation

## 2023-11-15 DIAGNOSIS — K2900 Acute gastritis without bleeding: Secondary | ICD-10-CM | POA: Diagnosis not present

## 2023-11-15 DIAGNOSIS — H6693 Otitis media, unspecified, bilateral: Secondary | ICD-10-CM | POA: Insufficient documentation

## 2023-11-15 DIAGNOSIS — Z794 Long term (current) use of insulin: Secondary | ICD-10-CM | POA: Diagnosis not present

## 2023-11-15 DIAGNOSIS — Z7901 Long term (current) use of anticoagulants: Secondary | ICD-10-CM | POA: Insufficient documentation

## 2023-11-15 LAB — URINALYSIS, ROUTINE W REFLEX MICROSCOPIC
Bilirubin Urine: NEGATIVE
Glucose, UA: NEGATIVE mg/dL
Ketones, ur: NEGATIVE mg/dL
Nitrite: NEGATIVE
Protein, ur: 30 mg/dL — AB
Specific Gravity, Urine: 1.023 (ref 1.005–1.030)
WBC, UA: >50 WBC/hpf (ref 0–5)
pH: 5 (ref 5.0–8.0)

## 2023-11-15 LAB — CBC
HCT: 43.1 % (ref 36.0–46.0)
Hemoglobin: 14.3 g/dL (ref 12.0–15.0)
MCH: 28.9 pg (ref 26.0–34.0)
MCHC: 33.2 g/dL (ref 30.0–36.0)
MCV: 87.2 fL (ref 80.0–100.0)
Platelets: 208 10*3/uL (ref 150–400)
RBC: 4.94 MIL/uL (ref 3.87–5.11)
RDW: 13.7 % (ref 11.5–15.5)
WBC: 9.5 10*3/uL (ref 4.0–10.5)
nRBC: 0 % (ref 0.0–0.2)

## 2023-11-15 LAB — RESP PANEL BY RT-PCR (RSV, FLU A&B, COVID)  RVPGX2
Influenza A by PCR: NEGATIVE
Influenza B by PCR: NEGATIVE
Resp Syncytial Virus by PCR: POSITIVE — AB
SARS Coronavirus 2 by RT PCR: NEGATIVE

## 2023-11-15 LAB — COMPREHENSIVE METABOLIC PANEL
ALT: 11 U/L (ref 0–44)
AST: 13 U/L — ABNORMAL LOW (ref 15–41)
Albumin: 3.4 g/dL — ABNORMAL LOW (ref 3.5–5.0)
Alkaline Phosphatase: 118 U/L (ref 38–126)
Anion gap: 11 (ref 5–15)
BUN: 10 mg/dL (ref 8–23)
CO2: 26 mmol/L (ref 22–32)
Calcium: 8.7 mg/dL — ABNORMAL LOW (ref 8.9–10.3)
Chloride: 99 mmol/L (ref 98–111)
Creatinine, Ser: 0.94 mg/dL (ref 0.44–1.00)
GFR, Estimated: >60 mL/min (ref >60)
Glucose, Bld: 256 mg/dL — ABNORMAL HIGH (ref 70–99)
Potassium: 3.7 mmol/L (ref 3.5–5.1)
Sodium: 136 mmol/L (ref 135–145)
Total Bilirubin: 0.7 mg/dL (ref 0.0–1.2)
Total Protein: 7.3 g/dL (ref 6.5–8.1)

## 2023-11-15 LAB — LIPASE, BLOOD: Lipase: 25 U/L (ref 11–51)

## 2023-11-15 LAB — TROPONIN I (HIGH SENSITIVITY): Troponin I (High Sensitivity): 5 ng/L (ref <18)

## 2023-11-15 MED ORDER — AMOXICILLIN-POT CLAVULANATE 875-125 MG PO TABS: 1.0000 | ORAL_TABLET | Freq: Two times a day (BID) | ORAL | 0 refills | Status: AC

## 2023-11-15 MED ORDER — ONDANSETRON 8 MG PO TBDP
8.0000 mg | ORAL_TABLET | Freq: Once | ORAL | Status: AC
  Administered 2023-11-15: 8 mg via ORAL
  Filled 2023-11-15: qty 1

## 2023-11-15 MED ORDER — MORPHINE SULFATE (PF) 2 MG/ML IV SOLN: 2.0000 mg | Freq: Once | INTRAVENOUS | Status: DC

## 2023-11-15 MED ORDER — OFLOXACIN 0.3 % OT SOLN: 10.0000 [drp] | Freq: Two times a day (BID) | OTIC | 0 refills | Status: AC

## 2023-11-15 MED ORDER — ONDANSETRON HCL 4 MG/2ML IJ SOLN: 4.0000 mg | Freq: Once | INTRAMUSCULAR | Status: DC

## 2023-11-15 MED ORDER — ALUM & MAG HYDROXIDE-SIMETH 200-200-20 MG/5ML PO SUSP
30.0000 mL | Freq: Once | ORAL | Status: AC
  Administered 2023-11-15: 30 mL via ORAL
  Filled 2023-11-15: qty 30

## 2023-11-15 NOTE — ED Triage Notes (Signed)
Pt arrived via POV from home c/o abdominal pain from a hernia on the left side, and left ear drainage. Pt reports the left ear began hurting this morning.

## 2023-11-15 NOTE — Discharge Instructions (Signed)
Your urine sample shows infection You also have an ear infection  Please take Augmentin, 1 tablet twice a day for the next 7 days, this is used to treat infections, it treats a variety of infections including animal bites, bacterial infections, and some gastrointestinal infections.   It is related to amoxicillin so do not take this medication if you are allergic to amoxicillin or penicillin.  effects of medications such as antibiotics include diarrhea which may occur as well as potentially inactivating birth control so if you are using a birth control pill please use an alternative form of birth control for the next 2 weeks.  There is occasions where this antibiotic does not work so if you are not improving within 48 hours you will need to be reevaluated immediately by your doctor or in the emergency department if your symptoms are worsening  Use the eardrops as prescribed  Thank you for allowing [place] to treat you in the emergency department today.  After reviewing your examination and potential testing that was done it appears that you are safe to go home.  I would like for you to follow-up with your doctor within the next several days, have them obtain your records and follow-up with them to review all potential tests and results from your visit.  If you should develop severe or worsening symptoms return to the emergency department immediately

## 2023-11-15 NOTE — ED Provider Notes (Signed)
[place] EMERGENCY DEPARTMENT AT [facility] Provider Note   CSN: [id number] Arrival date & time: 11/15/23  1059     History  Chief Complaint  Patient presents with   Abdominal Pain    [name] is a 78 y.o. female.  Patient is a 78 year old female with a past medical history of diabetes, hyperlipidemia, GERD who presents to the emergency department with a chief complaint of epigastric abdominal pain with radiation into her chest, bilateral ear pain, cough and congestion.  Patient notes that the epigastric pain has been ongoing for about the past 2 weeks.  She was evaluated by her primary care doctor and had an outpatient CT scan which demonstrated a fat-containing hernia to the left side of the abdomen as well as gastric inflammation.  Patient notes that last night she developed pain to bilateral ears as well as discharge from her right ear.  Patient does admit to associated moderate pain at this point.  She has had no associated nausea, vomiting, diarrhea.  She denies any melena, hematochezia, hematemesis.  Patient has had no recent falls or blunt trauma.  She denies any dysuria or hematuria.   Abdominal Pain      Home Medications Prior to Admission medications   Medication Sig Start Date End Date Taking? Authorizing Provider  sucralfate (CARAFATE) 1 g tablet Take 1 g by mouth 2 (two) times daily. 08/31/23  Yes [provider]  atorvastatin (LIPITOR) 20 MG tablet Take 1 tablet (20 mg total) by mouth daily. 06/11/16   Kerri Perches, MD  atorvastatin (LIPITOR) 20 MG tablet take 1 tablet by mouth once daily 12/31/16   Eustace Moore, MD  dicyclomine (BENTYL) 10 MG capsule Take 1 capsule (10 mg total) by mouth 4 (four) times daily -  before meals and at bedtime. 06/11/16   Kerri Perches, MD  DULoxetine (CYMBALTA) 30 MG capsule Take 1 capsule (30 mg total) by mouth daily. 06/11/16   Kerri Perches, MD  ELIQUIS 5 MG TABS tablet Take 1 tablet by  mouth every morning.    [provider]  glucose blood (ACCU-CHEK AVIVA PLUS) test strip Use as instructed for twice daily testing E11.9 06/11/16   Kerri Perches, MD  insulin detemir (LEVEMIR) 100 UNIT/ML injection Inject 0.25 mLs (25 Units total) into the skin 2 (two) times daily. 06/11/16   Kerri Perches, MD  lisinopril-hydrochlorothiazide (PRINZIDE,ZESTORETIC) 20-25 MG tablet Take 1 tablet by mouth daily. 06/25/16   Eustace Moore, MD  metFORMIN (GLUCOPHAGE) 500 MG tablet Take 1 tablet (500 mg total) by mouth 2 (two) times daily with a meal. 06/25/16   Eustace Moore, MD  mirabegron ER (MYRBETRIQ) 50 MG TB24 tablet Take 1 tablet (50 mg total) by mouth daily. 06/11/16   Kerri Perches, MD  pantoprazole (PROTONIX) 40 MG tablet Take 1 tablet (40 mg total) by mouth daily. 06/11/16   Kerri Perches, MD  pioglitazone (ACTOS) 30 MG tablet take 1 tablet by mouth once daily 01/11/17   Eustace Moore, MD  primidone (MYSOLINE) 50 MG tablet take 1 tablet by mouth three times a day 01/19/17   Eustace Moore, MD      Allergies    Patient has no known allergies.    Review of Systems   Review of Systems  HENT:  Positive for ear discharge and ear pain.   Gastrointestinal:  Positive for abdominal pain.  All other systems reviewed and are negative.  Physical Exam Updated Vital Signs BP (!) 150/78 (BP Location: Left Arm)   Pulse 72   Temp 99 F (37.2 C) (Oral)   Resp 20   Ht 5' (1.524 m)   Wt 91.6 kg   SpO2 96%   BMI 39.44 kg/m  Physical Exam Constitutional:      Appearance: Normal appearance.  HENT:     Head: Normocephalic and atraumatic.     Ears:     Comments: Left TM with erythema and bulging, right canal with swelling, erythema and purulent discharge, perforation noted to the right TM, external ears unremarkable with no overlying erythema or warmth, no mastoid tenderness bilaterally    Nose: Nose normal.     Mouth/Throat:     Mouth: Mucous  membranes are moist.  Eyes:     Extraocular Movements: Extraocular movements intact.     Conjunctiva/sclera: Conjunctivae normal.     Pupils: Pupils are equal, round, and reactive to light.  Cardiovascular:     Rate and Rhythm: Normal rate and regular rhythm.     Pulses: Normal pulses.     Heart sounds: Normal heart sounds.  Pulmonary:     Effort: Pulmonary effort is normal. No respiratory distress.     Breath sounds: Normal breath sounds. No stridor. No wheezing, rhonchi or rales.  Abdominal:     General: Abdomen is flat. Bowel sounds are normal. There is no distension.     Palpations: Abdomen is soft.     Comments: Mild epigastric tenderness  Musculoskeletal:        General: Normal range of motion.     Cervical back: Normal range of motion and neck supple.  Skin:    General: Skin is warm and dry.  Neurological:     General: No focal deficit present.     Mental Status: She is alert and oriented to person, place, and time. Mental status is at baseline.  Psychiatric:        Mood and Affect: Mood normal.        Behavior: Behavior normal.        Thought Content: Thought content normal.        Judgment: Judgment normal.     ED Results / Procedures / Treatments   Labs (all labs ordered are listed, but only abnormal results are displayed) Labs Reviewed  COMPREHENSIVE METABOLIC PANEL - Abnormal; Notable for the following components:      Result Value   Glucose, Bld 256 (*)    Calcium 8.7 (*)    Albumin 3.4 (*)    AST 13 (*)    All other components within normal limits  RESP PANEL BY RT-PCR (RSV, FLU A&B, COVID)  [id number]  LIPASE, BLOOD  CBC  URINALYSIS, ROUTINE W REFLEX MICROSCOPIC  TROPONIN I (HIGH SENSITIVITY)    EKG None  Radiology No results found.  Procedures Procedures    Medications Ordered in ED Medications  alum & mag hydroxide-simeth (MAALOX/MYLANTA) 200-200-20 MG/5ML suspension 30 mL (has no administration in time range)  ondansetron (ZOFRAN-ODT)  disintegrating tablet 8 mg (has no administration in time range)    ED Course/ Medical Decision Making/ A&P                                 Medical Decision Making Patient is doing well at this time and is stable for discharge home.  Discussed with patient that workup in emergency department has been unremarkable.  EKG had no acute ischemic changes and patient had a negative troponin.  Do not suspect ACS at this time.  Chest x-ray demonstrates no indication for pneumonia, pneumothorax, hemothorax.  Do not suspect underlying etiology such as pulmonary embolus.  Patient had a CT scan performed 3 days ago which demonstrated findings consistent with gastritis.  Do not suspect underlying surgical pathology at this point such as acute appendicitis, cholecystitis, bowel suction, diverticulitis, ovarian torsion or cyst, pyelonephritis, kidney stone.  Patient does have findings consistent with otitis media bilaterally and otitis externa to the right ear.  Will cover accordingly on an outpatient basis with antibiotics.  The need for close follow-up with primary care doctor on outpatient basis was discussed as well as strict turn precautions for any new or worsening symptoms.  Patient voiced understanding to the plan and had no additional questions.  Patient was evaluated by attending physician who is in agreement to plan at this time.  Patient is positive for RSV.  She has otherwise stable vital signs with no indication for sepsis no associated proxy.  Do not suspect that admission is warranted at this time.  Amount and/or Complexity of Data Reviewed Labs: ordered. Radiology: ordered.  Risk OTC drugs. Prescription drug management.           Final Clinical Impression(s) / ED Diagnoses Final diagnoses:  None    Rx / DC Orders ED Discharge Orders     None         Kathlen Mody 11/15/23 1906    Eber Hong, MD 11/15/23 [phone]

## 2023-11-18 LAB — URINE CULTURE: Culture: >=100000 — AB
